# Patient Record
Sex: Female | Born: 1965 | Race: White | Hispanic: No | Marital: Married | Smoking: Never smoker
Health system: Southern US, Community
[De-identification: ages and names within clinical notes are randomized; demographics above are authoritative.]

---

## 1997-06-28 ENCOUNTER — Emergency Department (HOSPITAL_COMMUNITY): Admission: EM | Admit: 1997-06-28 | Discharge: 1997-06-28 | Payer: Self-pay | Admitting: Emergency Medicine

## 1998-07-26 ENCOUNTER — Ambulatory Visit (HOSPITAL_COMMUNITY): Admission: RE | Admit: 1998-07-26 | Discharge: 1998-07-26 | Payer: Self-pay | Admitting: Obstetrics and Gynecology

## 1999-01-26 ENCOUNTER — Ambulatory Visit (HOSPITAL_COMMUNITY): Admission: RE | Admit: 1999-01-26 | Discharge: 1999-01-26 | Payer: Self-pay | Admitting: Obstetrics and Gynecology

## 1999-03-06 ENCOUNTER — Encounter: Payer: Self-pay | Admitting: Obstetrics and Gynecology

## 1999-03-06 ENCOUNTER — Ambulatory Visit (HOSPITAL_COMMUNITY): Admission: RE | Admit: 1999-03-06 | Discharge: 1999-03-06 | Payer: Self-pay | Admitting: Obstetrics and Gynecology

## 1999-12-26 ENCOUNTER — Inpatient Hospital Stay (HOSPITAL_COMMUNITY): Admission: AD | Admit: 1999-12-26 | Discharge: 1999-12-29 | Payer: Self-pay | Admitting: Obstetrics and Gynecology

## 1999-12-30 ENCOUNTER — Encounter: Admission: RE | Admit: 1999-12-30 | Discharge: 2000-02-14 | Payer: Self-pay | Admitting: Obstetrics and Gynecology

## 2000-10-01 ENCOUNTER — Encounter: Admission: RE | Admit: 2000-10-01 | Discharge: 2000-10-01 | Payer: Self-pay | Admitting: Obstetrics and Gynecology

## 2000-10-01 ENCOUNTER — Encounter: Payer: Self-pay | Admitting: Obstetrics and Gynecology

## 2001-06-26 ENCOUNTER — Other Ambulatory Visit: Admission: RE | Admit: 2001-06-26 | Discharge: 2001-06-26 | Payer: Self-pay | Admitting: Obstetrics and Gynecology

## 2001-12-23 ENCOUNTER — Encounter: Payer: Self-pay | Admitting: Obstetrics and Gynecology

## 2001-12-23 ENCOUNTER — Encounter: Admission: RE | Admit: 2001-12-23 | Discharge: 2001-12-23 | Payer: Self-pay | Admitting: Obstetrics and Gynecology

## 2003-03-02 ENCOUNTER — Other Ambulatory Visit: Admission: RE | Admit: 2003-03-02 | Discharge: 2003-03-02 | Payer: Self-pay | Admitting: Obstetrics and Gynecology

## 2004-01-03 ENCOUNTER — Ambulatory Visit (HOSPITAL_COMMUNITY): Admission: RE | Admit: 2004-01-03 | Discharge: 2004-01-03 | Payer: Self-pay | Admitting: Obstetrics and Gynecology

## 2017-10-12 ENCOUNTER — Emergency Department (HOSPITAL_BASED_OUTPATIENT_CLINIC_OR_DEPARTMENT_OTHER)
Admission: EM | Admit: 2017-10-12 | Discharge: 2017-10-13 | Disposition: A | Discharge status: Home / Self Care | Payer: BLUE CROSS/BLUE SHIELD | Attending: Emergency Medicine | Admitting: Emergency Medicine

## 2017-10-12 ENCOUNTER — Other Ambulatory Visit: Payer: Self-pay

## 2017-10-12 ENCOUNTER — Encounter (HOSPITAL_BASED_OUTPATIENT_CLINIC_OR_DEPARTMENT_OTHER): Payer: Self-pay | Admitting: *Deleted

## 2017-10-12 DIAGNOSIS — R002 Palpitations: Secondary | ICD-10-CM | POA: Insufficient documentation

## 2017-10-12 DIAGNOSIS — R42 Dizziness and giddiness: Secondary | ICD-10-CM | POA: Insufficient documentation

## 2017-10-12 NOTE — ED Notes (Signed)
Pt placed in supine position for orthostatic vital signs.

## 2017-10-12 NOTE — ED Notes (Signed)
Pt states she has noticed a couple of episodes of her heart racing and feeling dizzy over the past few days. "One really bad at airport Thursday and and slight one yesterday." Today was cleaning kitchen and felt a "wave" of dizziness come over her again. Hands and feet felt cold and clammy. Abd pain since this afternoon. Nauseated Thursday, but none today. Denies other s/s.

## 2017-10-12 NOTE — ED Triage Notes (Signed)
Pt reports she flew in from [place] yesterday. Reports hx of anxiety and panic with flying due to claustrophobia. States she had dizzy spell and palpitations this evening

## 2017-10-12 NOTE — ED Notes (Signed)
ED Provider at bedside. 

## 2017-10-12 NOTE — ED Provider Notes (Signed)
[facility] EMERGENCY DEPARTMENT Provider Note   CSN: [id number] Arrival date & time: 10/12/17  2239     History   Chief Complaint Chief Complaint  Patient presents with  . Palpitations    HPI Barbara Norman is a 52 y.o. female.  Patient is a 52 year old female with no significant past medical history.  She presents for evaluation of palpitations and dizziness.  She has now had 2 episodes of this.  The first occurred while in an airport in [place] awaiting a flight home after vacation.  She reports sudden onset of lightheadedness, dizziness, and feeling as though her heart was "pounding".  She felt short of breath and somewhat anxious.  She was evaluated by paramedics at the airport who found no concerning issues and she was not transported to the hospital.  She experienced a similar episode this evening at home while watching television with family.  Each of these episodes lasted for approximately 15 to 20 minutes.  She now feels back to baseline and has no complaints.  She denies any recent illness, excessive caffeine consumption, stress, or other new issues.  The history is provided by the patient.  Palpitations   This is a new problem. The current episode started 1 to 2 hours ago. The problem occurs constantly. The problem has been resolved. The problem is associated with an unknown factor. Associated symptoms include shortness of breath. Pertinent negatives include no diaphoresis, no fever, no chest pain, no chest pressure, no nausea and no vomiting. She has tried nothing for the symptoms. Risk factors include family history.    History reviewed. No pertinent past medical history.  There are no active problems to display for this patient.   History reviewed. No pertinent surgical history.   OB History   None      Home Medications    Prior to Admission medications   Not on File    Family History No family history on file.  Social History Social History    Tobacco Use  . Smoking status: Never Smoker  . Smokeless tobacco: Never Used  Substance Use Topics  . Alcohol use: Yes  . Drug use: Never     Allergies   Patient has no known allergies.   Review of Systems Review of Systems  Constitutional: Negative for diaphoresis and fever.  Respiratory: Positive for shortness of breath.   Cardiovascular: Positive for palpitations. Negative for chest pain.  Gastrointestinal: Negative for nausea and vomiting.  All other systems reviewed and are negative.    Physical Exam Updated Vital Signs BP (!) 144/94 (BP Location: Right Arm)   Pulse 63   Temp 98.1 F (36.7 C) (Oral)   Resp 16   Ht 5' 4.5" (1.638 m)   Wt 59 kg (130 lb)   SpO2 98%   BMI 21.97 kg/m   Physical Exam  Constitutional: She is oriented to person, place, and time. She appears well-developed and well-nourished. No distress.  HENT:  Head: Normocephalic and atraumatic.  Eyes: Pupils are equal, round, and reactive to light. EOM are normal.  Neck: Normal range of motion. Neck supple.  Cardiovascular: Normal rate and regular rhythm. Exam reveals no gallop and no friction rub.  No murmur heard. Pulmonary/Chest: Effort normal and breath sounds normal. No respiratory distress. She has no wheezes.  Abdominal: Soft. Bowel sounds are normal. She exhibits no distension. There is no tenderness.  Musculoskeletal: Normal range of motion. She exhibits no edema.  Neurological: She is alert and oriented  to person, place, and time. No cranial nerve deficit. She exhibits normal muscle tone. Coordination normal.  Skin: Skin is warm and dry. She is not diaphoretic.  Nursing note and vitals reviewed.    ED Treatments / Results  Labs (all labs ordered are listed, but only abnormal results are displayed) Labs Reviewed  BASIC METABOLIC PANEL  CBC WITH DIFFERENTIAL/PLATELET  TROPONIN I  TSH    EKG EKG Interpretation  Date/Time:  Saturday October 12 2017 22:47:28 EDT Ventricular  Rate:  60 PR Interval:    QRS Duration: 97 QT Interval:  427 QTC Calculation: 427 R Axis:   55 Text Interpretation:  Sinus rhythm Short PR interval Abnormal T, consider ischemia, anterior leads Confirmed by Geoffery LyonseLo, Briggitte Boline ([id number]) on 10/12/2017 11:26:53 PM   Radiology No results found.  Procedures Procedures (including critical care time)  Medications Ordered in ED Medications - No data to display   Initial Impression / Assessment and Plan / ED Course  I have reviewed the triage vital signs and the nursing notes.  Pertinent labs & imaging results that were available during my care of the patient were reviewed by me and considered in my medical decision making (see chart for details).  Patient presenting with episodes of palpitations and dizziness.  She has experienced 2 of these spells in the last week.  While in the ED, she has no complaints and physical examination is unremarkable.  Her laboratory studies show no evidence for anemia, electrolyte abnormality, and troponin is negative.  She does have nonspecific T wave abnormalities on her EKG, the significance of which I am uncertain.  He has no prior history of cardiac disease and we have no EKGs in our system to compare.  She does have a family history of heart problems with her father having an MI at a relatively young age.  I feel as though the patient may benefit from cardiology follow-up and possible event monitoring/stress testing as they see appropriate.  She will be discharged, to follow-up with cardiology and return as needed if she worsens.  Final Clinical Impressions(s) / ED Diagnoses   Final diagnoses:  None    ED Discharge Orders    None       Geoffery Lyonselo, Rhen Kawecki, MD 10/13/17 [phone]

## 2017-10-13 LAB — BASIC METABOLIC PANEL
Anion gap: 8 (ref 5–15)
BUN: 11 mg/dL (ref 6–20)
CALCIUM: 9.3 mg/dL (ref 8.9–10.3)
CO2: 26 mmol/L (ref 22–32)
Chloride: 105 mmol/L (ref 98–111)
Creatinine, Ser: 0.66 mg/dL (ref 0.44–1.00)
GFR calc Af Amer: >60 mL/min (ref >60)
GFR calc non Af Amer: >60 mL/min (ref >60)
Glucose, Bld: 111 mg/dL — ABNORMAL HIGH (ref 70–99)
Potassium: 4.2 mmol/L (ref 3.5–5.1)
Sodium: 139 mmol/L (ref 135–145)

## 2017-10-13 LAB — CBC WITH DIFFERENTIAL/PLATELET
Basophils Absolute: 0 10*3/uL (ref 0.0–0.1)
Basophils Relative: 0 %
Eosinophils Absolute: 0.1 10*3/uL (ref 0.0–0.7)
Eosinophils Relative: 2 %
HEMATOCRIT: 35.5 % — AB (ref 36.0–46.0)
HEMOGLOBIN: 12 g/dL (ref 12.0–15.0)
Lymphocytes Relative: 26 %
Lymphs Abs: 1.4 10*3/uL (ref 0.7–4.0)
MCH: 32 pg (ref 26.0–34.0)
MCHC: 33.8 g/dL (ref 30.0–36.0)
MCV: 94.7 fL (ref 78.0–100.0)
Monocytes Absolute: 0.5 10*3/uL (ref 0.1–1.0)
Monocytes Relative: 10 %
Neutro Abs: 3.3 10*3/uL (ref 1.7–7.7)
Neutrophils Relative %: 62 %
Platelets: 243 10*3/uL (ref 150–400)
RBC: 3.75 MIL/uL — ABNORMAL LOW (ref 3.87–5.11)
RDW: 12.1 % (ref 11.5–15.5)
WBC: 5.3 10*3/uL (ref 4.0–10.5)

## 2017-10-13 LAB — TSH: TSH: 2.639 u[IU]/mL (ref 0.350–4.500)

## 2017-10-13 LAB — TROPONIN I: Troponin I: <0.03 ng/mL (ref <0.03)

## 2017-10-13 NOTE — ED Notes (Signed)
Alert, NAD, calm, interactive, resps e/u, speaking in clear complete sentences, no dyspnea noted, skin W&D, VSS, (denies sx or complaints, including: pain, sob, nausea, palpitations, dizziness or visual changes). [facility]

## 2017-10-13 NOTE — Discharge Instructions (Addendum)
Follow-up with cardiology to discuss possible event monitoring or stress testing.  The contact information for the cardiology clinic on [address] has been provided in this discharge summary for you to call and make these arrangements.  Return to the emergency department if your symptoms worsen or change in the meantime.

## 2017-10-25 ENCOUNTER — Emergency Department (HOSPITAL_BASED_OUTPATIENT_CLINIC_OR_DEPARTMENT_OTHER)
Admission: EM | Admit: 2017-10-25 | Discharge: 2017-10-25 | Disposition: A | Discharge status: Home / Self Care | Payer: BLUE CROSS/BLUE SHIELD | Attending: Emergency Medicine | Admitting: Emergency Medicine

## 2017-10-25 ENCOUNTER — Other Ambulatory Visit: Payer: Self-pay

## 2017-10-25 ENCOUNTER — Emergency Department (HOSPITAL_BASED_OUTPATIENT_CLINIC_OR_DEPARTMENT_OTHER): Payer: BLUE CROSS/BLUE SHIELD

## 2017-10-25 ENCOUNTER — Encounter (HOSPITAL_BASED_OUTPATIENT_CLINIC_OR_DEPARTMENT_OTHER): Payer: Self-pay

## 2017-10-25 DIAGNOSIS — R002 Palpitations: Secondary | ICD-10-CM

## 2017-10-25 DIAGNOSIS — R42 Dizziness and giddiness: Secondary | ICD-10-CM | POA: Insufficient documentation

## 2017-10-25 DIAGNOSIS — R0789 Other chest pain: Secondary | ICD-10-CM | POA: Diagnosis not present

## 2017-10-25 LAB — CBC WITH DIFFERENTIAL/PLATELET
Basophils Absolute: 0 10*3/uL (ref 0.0–0.1)
Basophils Relative: 0 %
Eosinophils Absolute: 0 10*3/uL (ref 0.0–0.7)
Eosinophils Relative: 1 %
HCT: 37.3 % (ref 36.0–46.0)
Hemoglobin: 12.8 g/dL (ref 12.0–15.0)
LYMPHS ABS: 1.2 10*3/uL (ref 0.7–4.0)
LYMPHS PCT: 26 %
MCH: 32.3 pg (ref 26.0–34.0)
MCHC: 34.3 g/dL (ref 30.0–36.0)
MCV: 94.2 fL (ref 78.0–100.0)
MONO ABS: 0.5 10*3/uL (ref 0.1–1.0)
Monocytes Relative: 10 %
Neutro Abs: 2.9 10*3/uL (ref 1.7–7.7)
Neutrophils Relative %: 63 %
Platelets: 268 10*3/uL (ref 150–400)
RBC: 3.96 MIL/uL (ref 3.87–5.11)
RDW: 11.9 % (ref 11.5–15.5)
WBC: 4.6 10*3/uL (ref 4.0–10.5)

## 2017-10-25 LAB — D-DIMER, QUANTITATIVE (NOT AT ARMC)

## 2017-10-25 LAB — CK: Total CK: 53 U/L (ref 38–234)

## 2017-10-25 LAB — COMPREHENSIVE METABOLIC PANEL
ALT: 14 U/L (ref 0–44)
AST: 20 U/L (ref 15–41)
Albumin: 4.1 g/dL (ref 3.5–5.0)
Alkaline Phosphatase: 43 U/L (ref 38–126)
Anion gap: 7 (ref 5–15)
BUN: 15 mg/dL (ref 6–20)
CO2: 28 mmol/L (ref 22–32)
CREATININE: 0.73 mg/dL (ref 0.44–1.00)
Calcium: 8.9 mg/dL (ref 8.9–10.3)
Chloride: 103 mmol/L (ref 98–111)
Glucose, Bld: 144 mg/dL — ABNORMAL HIGH (ref 70–99)
Potassium: 3.9 mmol/L (ref 3.5–5.1)
Sodium: 138 mmol/L (ref 135–145)
Total Bilirubin: 0.7 mg/dL (ref 0.3–1.2)
Total Protein: 7 g/dL (ref 6.5–8.1)

## 2017-10-25 LAB — TSH: TSH: 1.023 u[IU]/mL (ref 0.350–4.500)

## 2017-10-25 LAB — TROPONIN I

## 2017-10-25 NOTE — ED Notes (Signed)
ED Provider at bedside. 

## 2017-10-25 NOTE — ED Triage Notes (Signed)
Pt c/o palpitations-intermittent since she was seen here for same 7/20-NAD-steady gait

## 2017-10-25 NOTE — ED Notes (Signed)
States saw her PCP and they tought she was having anxiety attack states she had sweating clammy dizzy when she feels her heart race , having burning in chest, thighs ,neck,left shoulder. Pain comes and goes but today has been constant she states

## 2017-10-25 NOTE — ED Provider Notes (Signed)
[facility] EMERGENCY DEPARTMENT Provider Note   CSN: [id number] Arrival date & time: 10/25/17  1437     History   Chief Complaint Chief Complaint  Patient presents with  . Palpitations    HPI Barbara Norman is a 52 y.o. female.  52 year old female who has had a couple weeks of progressively more prevalent episodes of lightheadedness, palpitations, chest burning subsequently associated with anxiety but not primarily anxious related.  She was seen here initially with a negative work-up told to follow-up with cardiology.  She follow with her primary doctor who thought it might be related to anxiety over the patient has no history of anxiety or depression.  She is think she is on the most call people she knows.  She does have an appoint with cardiology yet but the symptoms become more frequent and have associated burning in her chest, left arm and bilateral thighs.  Is not related to exertion.  When she feels the episodes, nausea tries to take deep breaths which does seem to help the palpitations prior things a little bit but the other symptoms persist.   Palpitations      History reviewed. No pertinent past medical history.  There are no active problems to display for this patient.   History reviewed. No pertinent surgical history.   OB History   None      Home Medications    Prior to Admission medications   Not on File    Family History No family history on file.  Social History Social History   Tobacco Use  . Smoking status: Never Smoker  . Smokeless tobacco: Never Used  Substance Use Topics  . Alcohol use: Yes    Comment: daily  . Drug use: Never     Allergies   Patient has no known allergies.   Review of Systems Review of Systems  Cardiovascular: Positive for palpitations.  All other systems reviewed and are negative.    Physical Exam Updated Vital Signs BP 102/72   Pulse (!) 59   Temp 98.4 F (36.9 C) (Oral)   Resp 17   Ht 5'  4" (1.626 m)   Wt 60.8 kg (134 lb)   SpO2 96%   BMI 23.00 kg/m   Physical Exam  Constitutional: She is oriented to person, place, and time. She appears well-developed and well-nourished.  HENT:  Head: Normocephalic and atraumatic.  Eyes: Conjunctivae and EOM are normal.  Neck: Normal range of motion.  Cardiovascular: Normal rate and regular rhythm.  Pulmonary/Chest: Effort normal and breath sounds normal. No stridor. No respiratory distress.  Abdominal: Soft. She exhibits no distension.  Musculoskeletal: Normal range of motion. She exhibits no edema or deformity.  Neurological: She is alert and oriented to person, place, and time. No cranial nerve deficit. Coordination normal.  Skin: Skin is warm and dry.  Nursing note and vitals reviewed.   ED Treatments / Results  Labs (all labs ordered are listed, but only abnormal results are displayed) Labs Reviewed  COMPREHENSIVE METABOLIC PANEL - Abnormal; Notable for the following components:      Result Value   Glucose, Bld 144 (*)    All other components within normal limits  CBC WITH DIFFERENTIAL/PLATELET  D-DIMER, QUANTITATIVE (NOT AT [facility])  TROPONIN I  CK  TSH  ESTROGENS, TOTAL  FOLLICLE STIMULATING HORMONE    EKG EKG Interpretation  Date/Time:  Friday October 25 2017 14:53:47 EDT Ventricular Rate:  68 PR Interval:    QRS Duration: 92 QT  Interval:  421 QTC Calculation: 448 R Axis:   64 Text Interpretation:  Sinus rhythm Short PR interval Nonspecific T abnormalities, anterior leads No significant change since last tracing Confirmed by Marily Memos [phone]) on 10/25/2017 3:01:08 PM   Radiology Dg Chest 2 View  Result Date: 10/25/2017 CLINICAL DATA:  Cardiac palpitations for 2 weeks EXAM: CHEST - 2 VIEW COMPARISON:  None. FINDINGS: The heart size and mediastinal contours are within normal limits. Both lungs are clear. The visualized skeletal structures are unremarkable. IMPRESSION: No active cardiopulmonary disease.  Electronically Signed   By: Alcide Clever M.D.   On: 10/25/2017 16:42    Procedures Procedures (including critical care time)  Medications Ordered in ED Medications - No data to display   Initial Impression / Assessment and Plan / ED Course  I have reviewed the triage vital signs and the nursing notes.  Pertinent labs & imaging results that were available during my care of the patient were reviewed by me and considered in my medical decision making (see chart for details).     Low suspicion for emergent causes of her symptoms however it did start after her trip to [place] so we will get a d-dimer to rule out PE.  Could be anxiety but she has no history of anxiety and I think that is the least likely of this.  It is unlikely she is just mainly developed anxiety in her early 4s.  Could be paroxysmal atrial fibrillation however it sounds like she is actually evaluated during one episode and did not have an abnormal heart rate.  Her blood pressure was up during that episode in the 140s she has no history of high blood pressures are considered pheochromocytoma however that is obviously rare.  She is also currently going through menopause and could be just hormonal fluctuations that are causing symptoms as well.  Will evaluate here for any emergencies, reassurance needs likely cardiology follow-up.  Likely menopause? Doubt pheo, paroxysmal Afib or other life threatening issue at this time but if Gyn does not agree then cardiology follow up would be appropriate.   Final Clinical Impressions(s) / ED Diagnoses   Final diagnoses:  Palpitations    ED Discharge Orders    None       Shylyn Younce, Barbara Cower, MD 10/25/17 [phone]

## 2017-10-26 LAB — FOLLICLE STIMULATING HORMONE: FSH: 27.5 m[IU]/mL

## 2017-10-27 LAB — ESTROGENS, TOTAL: ESTROGEN: 60 pg/mL

## 2019-07-29 IMAGING — CR DG CHEST 2V
2 series · 2 of 2 positions shown · non-contrast
Comparison: None.

CLINICAL DATA: Cardiac palpitations for 2 weeks

EXAM:
CHEST - 2 VIEW

[w chest pa]
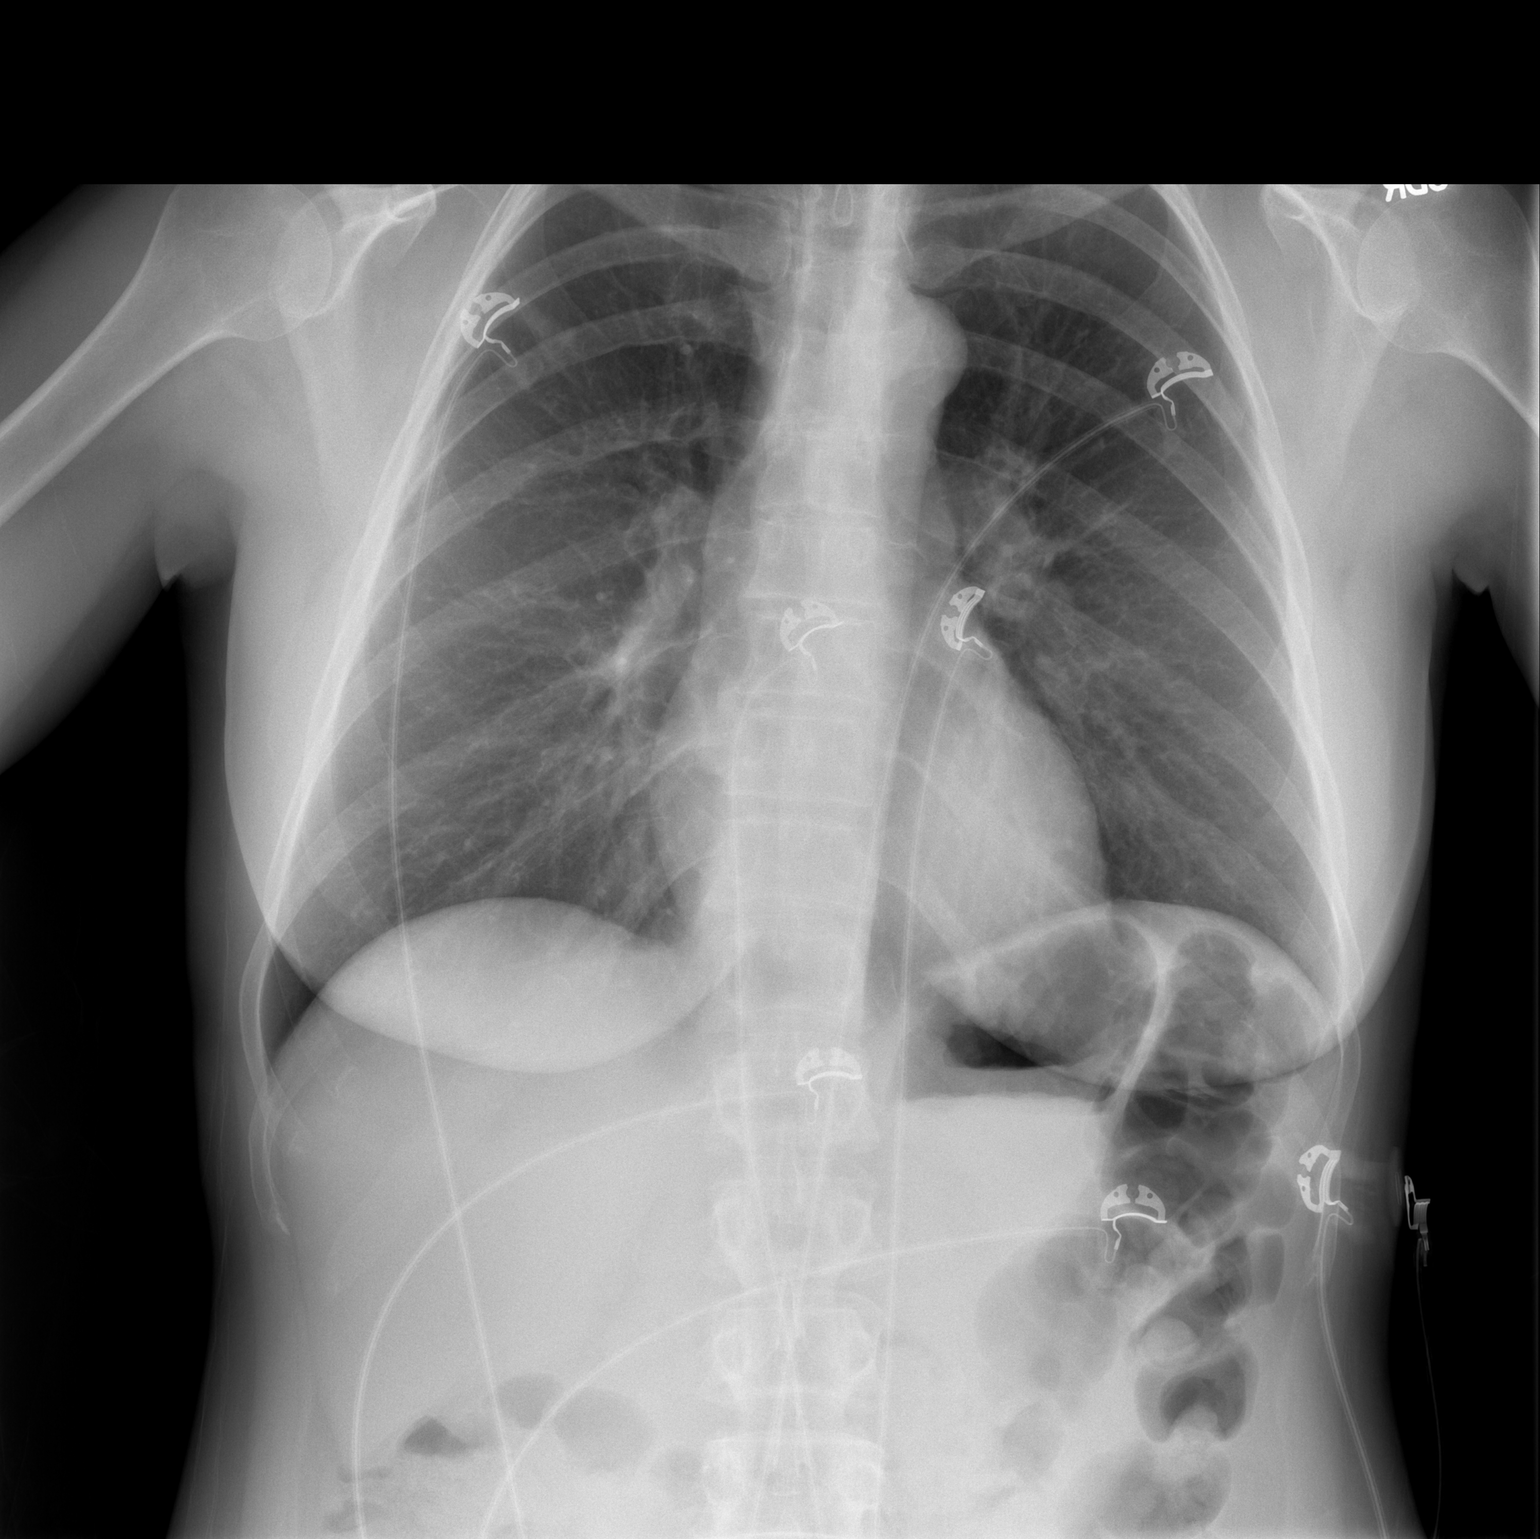

[w chest lat]
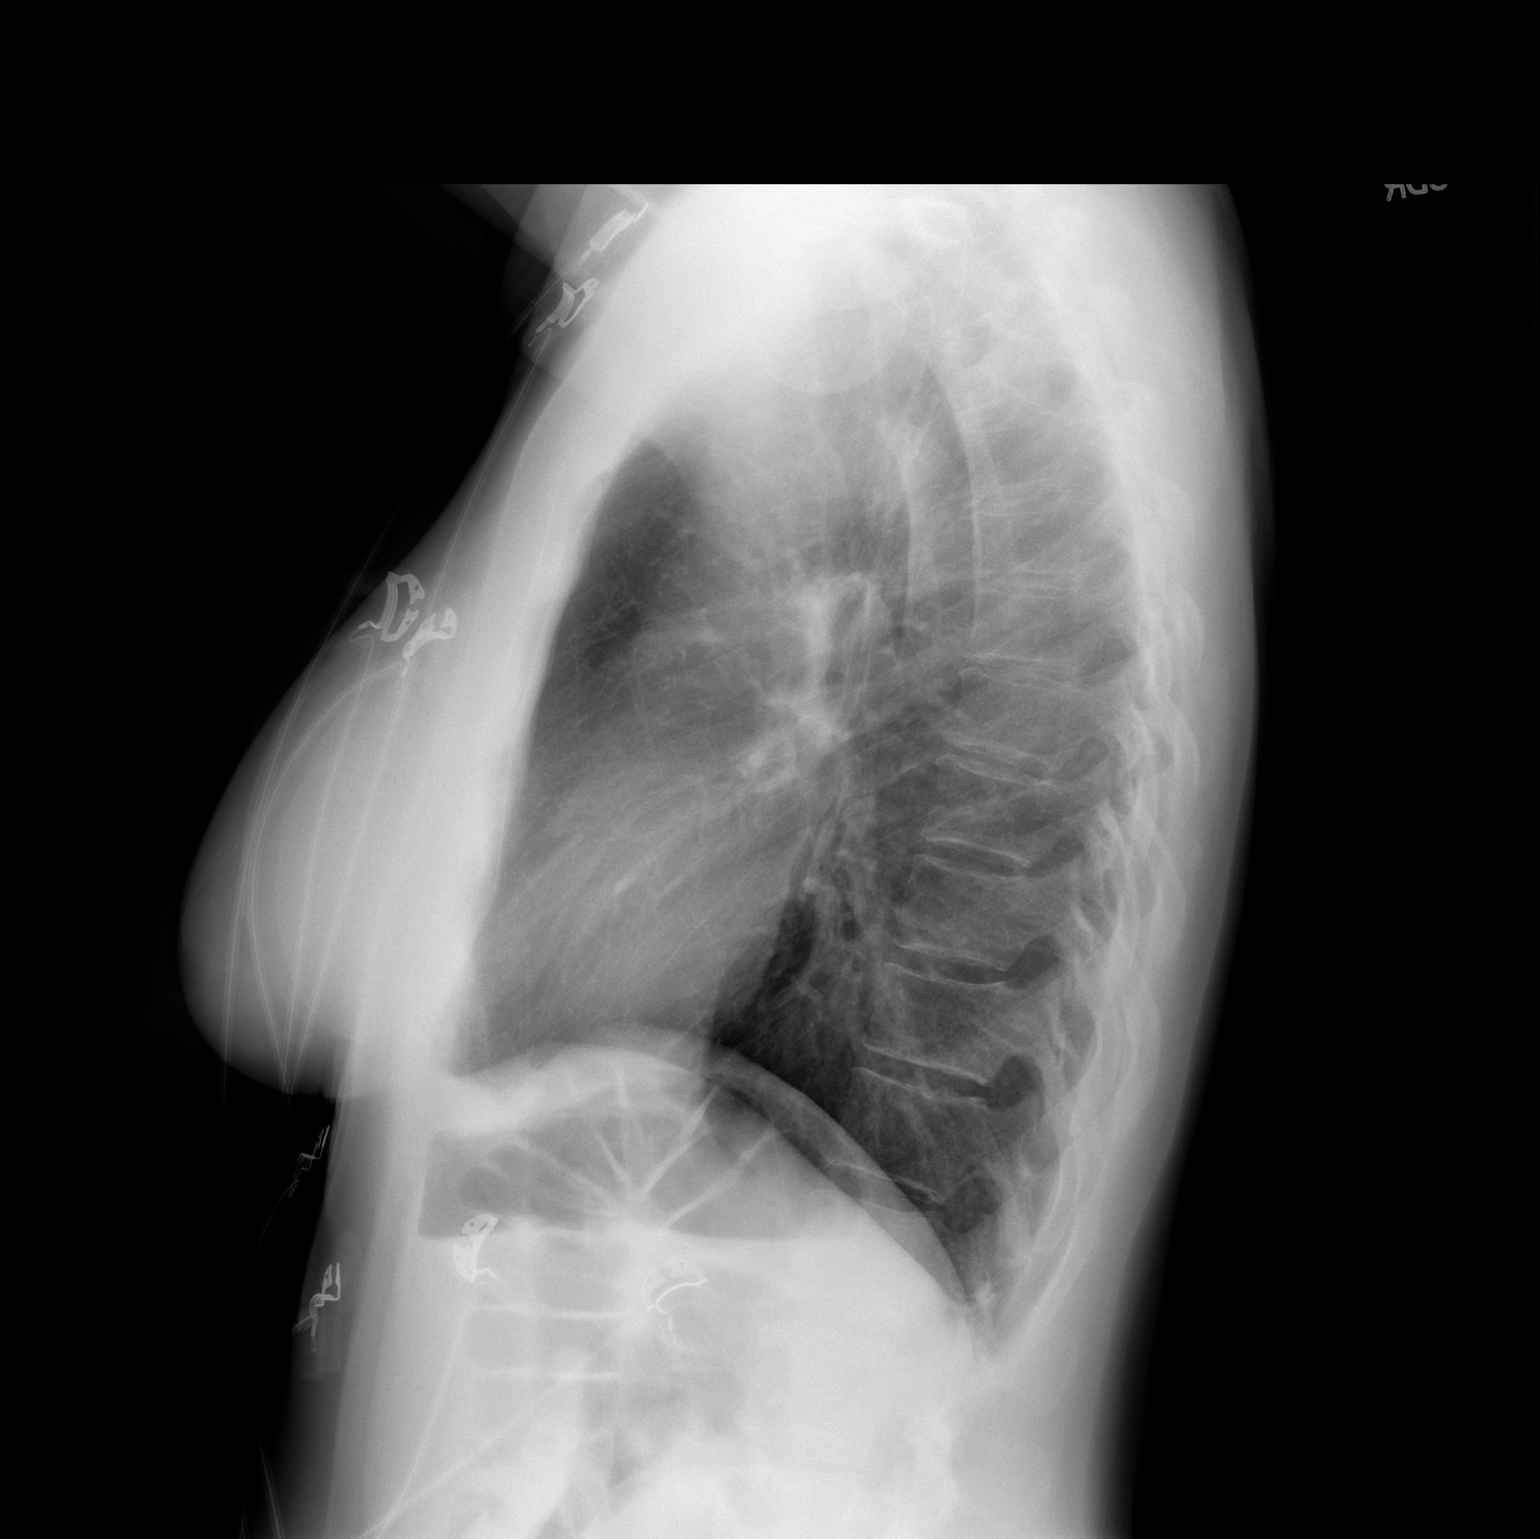

[2 of 2 positions shown; findings below may reference images not displayed]

FINDINGS: The heart size and mediastinal contours are within normal limits.
Both lungs are clear. The visualized skeletal structures are
unremarkable.
IMPRESSION: No active cardiopulmonary disease.

## 2021-09-06 LAB — COLOGUARD: COLOGUARD: NEGATIVE

## 2022-05-02 ENCOUNTER — Other Ambulatory Visit: Payer: Self-pay

## 2022-05-02 ENCOUNTER — Emergency Department (HOSPITAL_BASED_OUTPATIENT_CLINIC_OR_DEPARTMENT_OTHER): Payer: 59

## 2022-05-02 ENCOUNTER — Encounter (HOSPITAL_BASED_OUTPATIENT_CLINIC_OR_DEPARTMENT_OTHER): Payer: Self-pay | Admitting: Emergency Medicine

## 2022-05-02 ENCOUNTER — Emergency Department (HOSPITAL_BASED_OUTPATIENT_CLINIC_OR_DEPARTMENT_OTHER)
Admission: EM | Admit: 2022-05-02 | Discharge: 2022-05-02 | Disposition: A | Discharge status: Home / Self Care | Payer: 59 | Attending: Emergency Medicine | Admitting: Emergency Medicine

## 2022-05-02 DIAGNOSIS — R42 Dizziness and giddiness: Secondary | ICD-10-CM | POA: Diagnosis not present

## 2022-05-02 DIAGNOSIS — R079 Chest pain, unspecified: Secondary | ICD-10-CM | POA: Diagnosis not present

## 2022-05-02 DIAGNOSIS — R519 Headache, unspecified: Secondary | ICD-10-CM | POA: Diagnosis not present

## 2022-05-02 DIAGNOSIS — R002 Palpitations: Secondary | ICD-10-CM | POA: Insufficient documentation

## 2022-05-02 DIAGNOSIS — H538 Other visual disturbances: Secondary | ICD-10-CM | POA: Insufficient documentation

## 2022-05-02 LAB — CBC
HCT: 37.8 % (ref 36.0–46.0)
Hemoglobin: 12.8 g/dL (ref 12.0–15.0)
MCH: 31.8 pg (ref 26.0–34.0)
MCHC: 33.9 g/dL (ref 30.0–36.0)
MCV: 93.8 fL (ref 80.0–100.0)
Platelets: 276 10*3/uL (ref 150–400)
RBC: 4.03 MIL/uL (ref 3.87–5.11)
RDW: 11.9 % (ref 11.5–15.5)
WBC: 5.2 10*3/uL (ref 4.0–10.5)
nRBC: 0 % (ref 0.0–0.2)

## 2022-05-02 LAB — BASIC METABOLIC PANEL
Anion gap: 8 (ref 5–15)
BUN: 11 mg/dL (ref 6–20)
CO2: 26 mmol/L (ref 22–32)
Calcium: 9.3 mg/dL (ref 8.9–10.3)
Chloride: 101 mmol/L (ref 98–111)
Creatinine, Ser: 0.77 mg/dL (ref 0.44–1.00)
GFR, Estimated: >60 mL/min (ref >60)
Glucose, Bld: 148 mg/dL — ABNORMAL HIGH (ref 70–99)
Potassium: 3.9 mmol/L (ref 3.5–5.1)
Sodium: 135 mmol/L (ref 135–145)

## 2022-05-02 LAB — TROPONIN I (HIGH SENSITIVITY)
Troponin I (High Sensitivity): 2 ng/L (ref <18)
Troponin I (High Sensitivity): 2 ng/L (ref <18)

## 2022-05-02 NOTE — ED Notes (Signed)
Visual acuity completed per order. Pt wears contact lenses, reports things seem "bigger and magnified" in left eye and blurry. No problems with right eye.

## 2022-05-02 NOTE — Discharge Instructions (Signed)
You are seen in the emergency department for palpitations chest pain blurry vision dizziness.  You had blood work EKG chest x-ray that did not show an obvious explanation for your symptoms.  Please follow-up with your primary care doctor and your eye doctor.  We are also giving you contact information for neurology.  Return to the emergency department if any worsening or concerning symptoms.

## 2022-05-02 NOTE — ED Triage Notes (Addendum)
Patient presents to ED via POV from home. Here with palpitations and left eye blurry vision. Endorses nausea, denies vomiting. Denies shortness of breath. Reports dizziness as well.

## 2022-05-02 NOTE — ED Notes (Signed)
Pt A&ox4 ambulatory at d/c with independent steady gait. Pt verbalized understanding of d/c instructions and follow up care.

## 2022-05-02 NOTE — ED Provider Notes (Signed)
[name] Provider Note   CSN: [id number] Arrival date & time: 05/02/22  1605     History {Add pertinent medical, surgical, social history, OB history to HPI:1} Chief Complaint  Patient presents with   Palpitations    Barbara Norman is a 57 y.o. female.  She is presenting with multiple complaints from her PCPs office.  She said she woke up around 4 this morning and had a racing heart.  It is associated with some chest discomfort.  She has had this before and has seen cardiology in the past without a clear diagnosis.  Today at work around 11 AM she experienced some blurry vision in her left eye.  There was no loss of vision and no pain in her eye.  She also felt dizzy lightheaded like she might pass out.  She feels a low-grade headache.  She has had the dizziness and headache before.  She does wear contact lenses but states her vision is very good even without the contacts.  She talked to her PCP and saw her today who recommended she come here for further evaluation.  She said the dizziness has mostly resolved and the blurry vision is better although not completely resolved.  No numbness or weakness.  The history is provided by the patient.  Palpitations Palpitations quality:  Fast Onset quality:  Sudden Progression:  Resolved Chronicity:  Recurrent Relieved by:  None tried Worsened by:  Nothing Ineffective treatments:  None tried Associated symptoms: chest pain and dizziness   Associated symptoms: no vomiting   Eye Problem Location:  Left eye Quality: blurry. Severity:  Moderate Onset quality:  Sudden Duration:  6 hours Timing:  Constant Progression:  Partially resolved Chronicity:  New Context: contact lenses   Relieved by:  None tried Worsened by:  Nothing Ineffective treatments:  None tried Associated symptoms: blurred vision and headaches   Associated symptoms: no double vision, no facial rash, no photophobia and no  vomiting        Home Medications Prior to Admission medications   Not on File      Allergies    Patient has no known allergies.    Review of Systems   Review of Systems  Constitutional:  Negative for fever.  Eyes:  Positive for blurred vision. Negative for double vision and photophobia.  Cardiovascular:  Positive for chest pain and palpitations.  Gastrointestinal:  Negative for vomiting.  Neurological:  Positive for dizziness and headaches.    Physical Exam Updated Vital Signs BP (!) 154/86 (BP Location: Right Arm)   Pulse 71   Temp 98.5 F (36.9 C)   Resp 20   Ht 5\' 4"  (1.626 m)   Wt 54.4 kg   SpO2 100%   BMI 20.60 kg/m  Physical Exam Vitals and nursing note reviewed.  Constitutional:      General: She is not in acute distress.    Appearance: Normal appearance. She is well-developed.  HENT:     Head: Normocephalic and atraumatic.  Eyes:     General: Lids are normal. Vision grossly intact. Gaze aligned appropriately.     Extraocular Movements: Extraocular movements intact.     Conjunctiva/sclera: Conjunctivae normal.     Pupils: Pupils are equal, round, and reactive to light.     Funduscopic exam:       Left eye: No hemorrhage or papilledema.     Comments: anterior chamber clear and deep  Cardiovascular:     Rate and  Rhythm: Normal rate and regular rhythm.     Heart sounds: No murmur heard. Pulmonary:     Effort: Pulmonary effort is normal. No respiratory distress.     Breath sounds: Normal breath sounds.  Abdominal:     Palpations: Abdomen is soft.     Tenderness: There is no abdominal tenderness.  Musculoskeletal:        General: No swelling.     Cervical back: Neck supple.  Skin:    General: Skin is warm and dry.     Capillary Refill: Capillary refill takes less than 2 seconds.  Neurological:     Mental Status: She is alert.  Psychiatric:        Mood and Affect: Mood normal.     ED Results / Procedures / Treatments   Labs (all labs ordered  are listed, but only abnormal results are displayed) Labs Reviewed  BASIC METABOLIC PANEL - Abnormal; Notable for the following components:      Result Value   Glucose, Bld 148 (*)    All other components within normal limits  CBC  PREGNANCY, URINE  TROPONIN I (HIGH SENSITIVITY)  TROPONIN I (HIGH SENSITIVITY)    EKG EKG Interpretation  Date/Time:  Wednesday May 02 2022 16:11:10 EST Ventricular Rate:  70 PR Interval:  102 QRS Duration: 111 QT Interval:  399 QTC Calculation: 431 R Axis:   53 Text Interpretation: Sinus rhythm Short PR interval Nonspecific T abnormalities, diffuse leads No significant change since prior 8/19 Confirmed by Aletta Edouard [phone]) on 05/02/2022 4:14:26 PM  Radiology DG Chest 2 View  Result Date: 05/02/2022 CLINICAL DATA:  Chest pain. EXAM: CHEST - 2 VIEW COMPARISON:  Radiographs 10/25/2017. FINDINGS: The heart size and mediastinal contours are stable. The lungs are clear. There is no pleural effusion or pneumothorax. No acute osseous findings are identified. Bilateral breast implants are noted. There is a mild convex right thoracic scoliosis. IMPRESSION: No active cardiopulmonary process. Electronically Signed   By: Richardean Sale M.D.   On: 05/02/2022 16:27    Procedures Procedures  {Document cardiac monitor, telemetry assessment procedure when appropriate:1}  Medications Ordered in ED Medications - No data to display  ED Course/ Medical Decision Making/ A&P   {   Click here for ABCD2, HEART and other calculatorsREFRESH Note before signing :1}                          Medical Decision Making Amount and/or Complexity of Data Reviewed Labs: ordered. Radiology: ordered.   This patient complains of ***; this involves an extensive number of treatment Options and is a complaint that carries with it a high risk of complications and morbidity. The differential includes ***  I ordered, reviewed and interpreted labs, which included *** I  ordered medication *** and reviewed PMP when indicated. I ordered imaging studies which included *** and I independently    visualized and interpreted imaging which showed *** Additional history obtained from *** Previous records obtained and reviewed *** I consulted *** and discussed lab and imaging findings and discussed disposition.  Cardiac monitoring reviewed, *** Social determinants considered, *** Critical Interventions: ***  After the interventions stated above, I reevaluated the patient and found *** Admission and further testing considered, ***   {Document critical care time when appropriate:1} {Document review of labs and clinical decision tools ie heart score, Chads2Vasc2 etc:1}  {Document your independent review of radiology images, and any outside records:1} {Document your discussion with  family members, caretakers, and with consultants:1} {Document social determinants of health affecting pt's care:1} {Document your decision making why or why not admission, treatments were needed:1} Final Clinical Impression(s) / ED Diagnoses Final diagnoses:  None    Rx / DC Orders ED Discharge Orders     None

## 2022-05-18 DIAGNOSIS — B342 Coronavirus infection, unspecified: Secondary | ICD-10-CM | POA: Diagnosis not present

## 2022-07-03 DIAGNOSIS — Z8632 Personal history of gestational diabetes: Secondary | ICD-10-CM | POA: Diagnosis not present

## 2022-07-03 DIAGNOSIS — R7309 Other abnormal glucose: Secondary | ICD-10-CM | POA: Diagnosis not present

## 2022-07-03 DIAGNOSIS — H538 Other visual disturbances: Secondary | ICD-10-CM | POA: Diagnosis not present

## 2022-07-03 DIAGNOSIS — Z8616 Personal history of COVID-19: Secondary | ICD-10-CM | POA: Diagnosis not present

## 2022-07-03 DIAGNOSIS — R202 Paresthesia of skin: Secondary | ICD-10-CM | POA: Diagnosis not present

## 2022-07-03 DIAGNOSIS — E785 Hyperlipidemia, unspecified: Secondary | ICD-10-CM | POA: Diagnosis not present

## 2022-07-03 DIAGNOSIS — R0789 Other chest pain: Secondary | ICD-10-CM | POA: Diagnosis not present

## 2022-07-03 DIAGNOSIS — R200 Anesthesia of skin: Secondary | ICD-10-CM | POA: Diagnosis not present

## 2022-07-03 DIAGNOSIS — R002 Palpitations: Secondary | ICD-10-CM | POA: Diagnosis not present

## 2022-10-31 DIAGNOSIS — U071 COVID-19: Secondary | ICD-10-CM | POA: Diagnosis not present

## 2022-12-07 DIAGNOSIS — R9431 Abnormal electrocardiogram [ECG] [EKG]: Secondary | ICD-10-CM | POA: Diagnosis not present

## 2022-12-07 DIAGNOSIS — R002 Palpitations: Secondary | ICD-10-CM | POA: Diagnosis not present

## 2022-12-07 DIAGNOSIS — R0789 Other chest pain: Secondary | ICD-10-CM | POA: Diagnosis not present

## 2022-12-07 DIAGNOSIS — R55 Syncope and collapse: Secondary | ICD-10-CM | POA: Diagnosis not present

## 2022-12-08 DIAGNOSIS — R9431 Abnormal electrocardiogram [ECG] [EKG]: Secondary | ICD-10-CM | POA: Diagnosis not present

## 2023-01-01 DIAGNOSIS — R0789 Other chest pain: Secondary | ICD-10-CM | POA: Diagnosis not present

## 2023-01-01 DIAGNOSIS — I340 Nonrheumatic mitral (valve) insufficiency: Secondary | ICD-10-CM | POA: Diagnosis not present

## 2023-01-01 DIAGNOSIS — R002 Palpitations: Secondary | ICD-10-CM | POA: Diagnosis not present

## 2023-01-04 DIAGNOSIS — Z23 Encounter for immunization: Secondary | ICD-10-CM | POA: Diagnosis not present

## 2023-01-04 DIAGNOSIS — R030 Elevated blood-pressure reading, without diagnosis of hypertension: Secondary | ICD-10-CM | POA: Diagnosis not present

## 2023-01-04 DIAGNOSIS — Z0000 Encounter for general adult medical examination without abnormal findings: Secondary | ICD-10-CM | POA: Diagnosis not present

## 2023-01-04 DIAGNOSIS — E782 Mixed hyperlipidemia: Secondary | ICD-10-CM | POA: Diagnosis not present

## 2023-01-04 DIAGNOSIS — R7303 Prediabetes: Secondary | ICD-10-CM | POA: Diagnosis not present

## 2023-02-05 DIAGNOSIS — R0789 Other chest pain: Secondary | ICD-10-CM | POA: Diagnosis not present

## 2023-02-05 DIAGNOSIS — R002 Palpitations: Secondary | ICD-10-CM | POA: Diagnosis not present

## 2023-02-11 DIAGNOSIS — R0789 Other chest pain: Secondary | ICD-10-CM | POA: Diagnosis not present

## 2023-02-11 DIAGNOSIS — R002 Palpitations: Secondary | ICD-10-CM | POA: Diagnosis not present

## 2023-03-12 DIAGNOSIS — Z01411 Encounter for gynecological examination (general) (routine) with abnormal findings: Secondary | ICD-10-CM | POA: Diagnosis not present

## 2023-03-12 DIAGNOSIS — Z113 Encounter for screening for infections with a predominantly sexual mode of transmission: Secondary | ICD-10-CM | POA: Diagnosis not present

## 2023-03-12 DIAGNOSIS — Z1231 Encounter for screening mammogram for malignant neoplasm of breast: Secondary | ICD-10-CM | POA: Diagnosis not present

## 2023-03-12 DIAGNOSIS — Z124 Encounter for screening for malignant neoplasm of cervix: Secondary | ICD-10-CM | POA: Diagnosis not present

## 2023-03-12 DIAGNOSIS — Z01419 Encounter for gynecological examination (general) (routine) without abnormal findings: Secondary | ICD-10-CM | POA: Diagnosis not present

## 2023-03-12 DIAGNOSIS — Z1331 Encounter for screening for depression: Secondary | ICD-10-CM | POA: Diagnosis not present

## 2023-12-20 ENCOUNTER — Other Ambulatory Visit: Payer: Self-pay | Admitting: Medical Genetics

## 2024-03-24 ENCOUNTER — Other Ambulatory Visit (HOSPITAL_COMMUNITY)
